# Patient Record
Sex: Male | Born: 1960 | Race: White | Hispanic: No | Marital: Married | Smoking: Current every day smoker
Health system: Southern US, Community
[De-identification: ages and names within clinical notes are randomized; demographics above are authoritative.]

## PROBLEM LIST (undated history)

## (undated) DIAGNOSIS — J449 Chronic obstructive pulmonary disease, unspecified: Secondary | ICD-10-CM

## (undated) DIAGNOSIS — J439 Emphysema, unspecified: Secondary | ICD-10-CM

## (undated) DIAGNOSIS — K859 Acute pancreatitis without necrosis or infection, unspecified: Secondary | ICD-10-CM

## (undated) DIAGNOSIS — G939 Disorder of brain, unspecified: Secondary | ICD-10-CM

## (undated) HISTORY — PX: INGUINAL HERNIA REPAIR: SHX194

## (undated) HISTORY — DX: Acute pancreatitis without necrosis or infection, unspecified: K85.90

## (undated) HISTORY — DX: Chronic obstructive pulmonary disease, unspecified: J44.9

## (undated) HISTORY — DX: Disorder of brain, unspecified: G93.9

## (undated) HISTORY — PX: PROSTATE SURGERY: SHX751

## (undated) HISTORY — DX: Emphysema, unspecified: J43.9

---

## 2010-05-17 ENCOUNTER — Ambulatory Visit (HOSPITAL_BASED_OUTPATIENT_CLINIC_OR_DEPARTMENT_OTHER): Admission: RE | Admit: 2010-05-17 | Discharge: 2010-05-17 | Payer: Self-pay | Admitting: Internal Medicine

## 2010-05-17 ENCOUNTER — Ambulatory Visit: Payer: Self-pay | Admitting: Diagnostic Radiology

## 2010-11-18 IMAGING — CT CT CHEST W/O CM
2 of 3 series · 15 of 36 positions shown, 18 images · non-contrast
Comparison: No prior chest radiographs are available.

CLINICAL DATA: Hilar mass identified on chest x-ray. History of
smoking.

CT CHEST WITHOUT CONTRAST
TECHNIQUE: Multidetector CT imaging of the chest was performed
following the standard protocol without IV contrast.

[Series 2: chest 5.0 b31f · axial · 0.73mm/px · z∈[+106,+356]mm · 12 of 60 slices shown, 15 images]
[im 5/60  mediastinal]
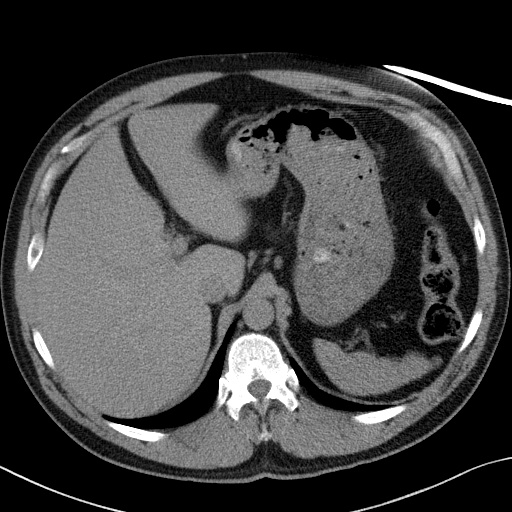
[im 5/60  lung]
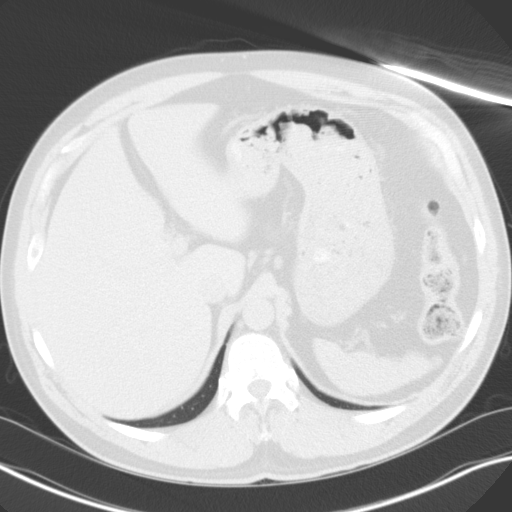
[im 9/60  lung]
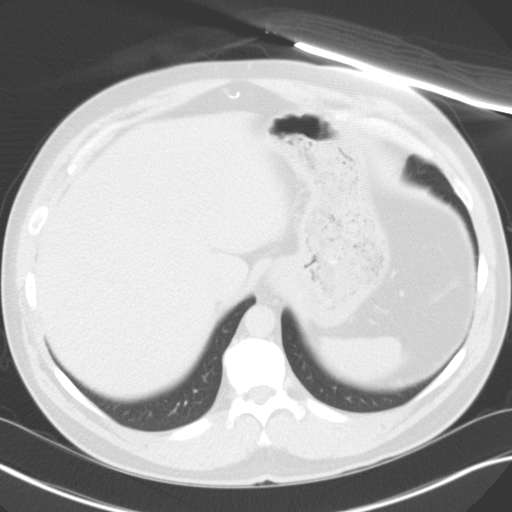
[im 14/60  lung]
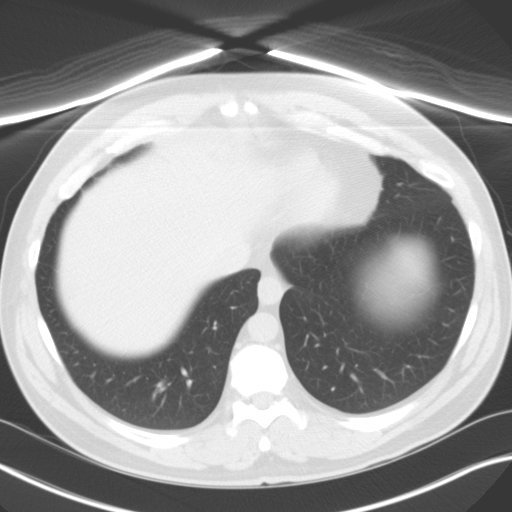
[im 18/60  lung]
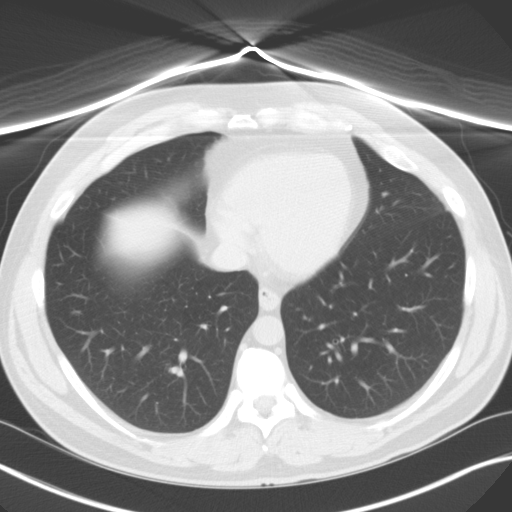
[im 22/60  mediastinal]
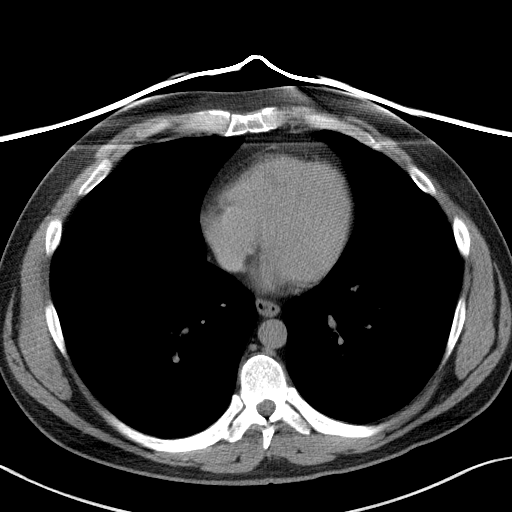
[im 22/60  lung]
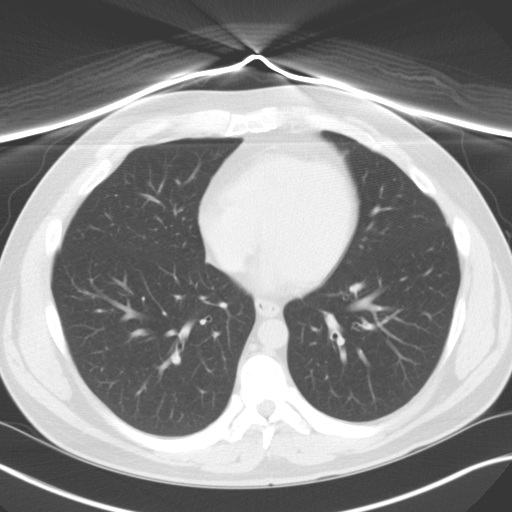
[im 27/60  lung]
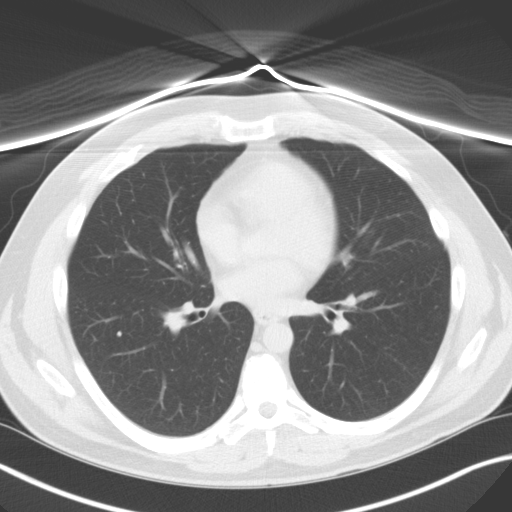
[im 33/60  lung]
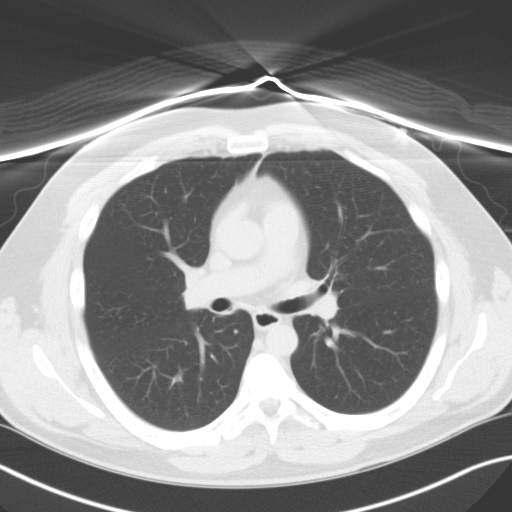
[im 38/60  lung]
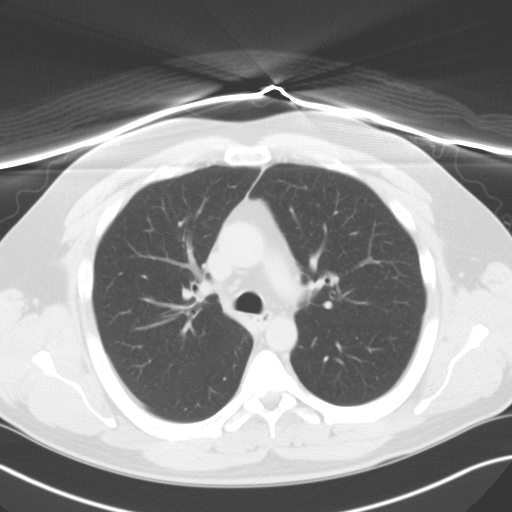
[im 42/60  mediastinal]
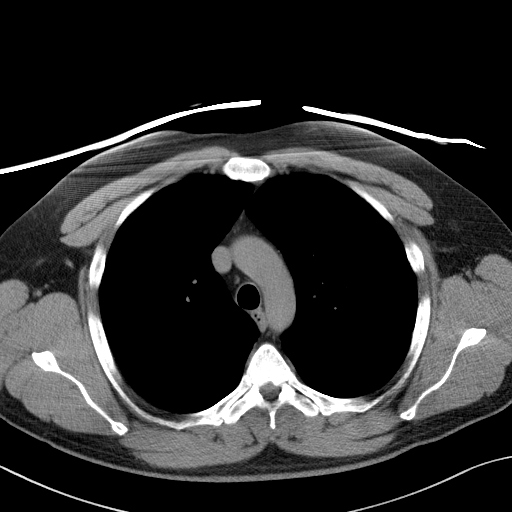
[im 42/60  lung]
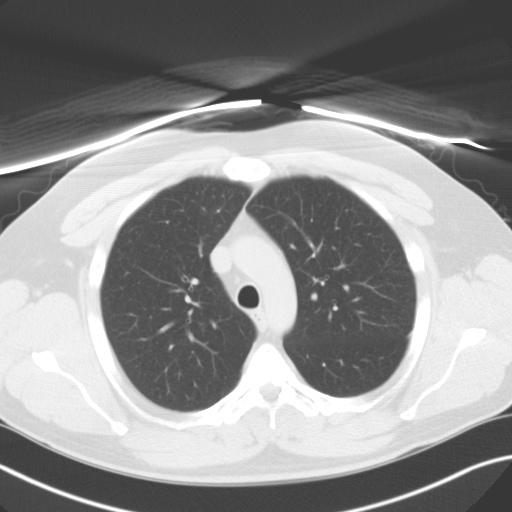
[im 46/60  lung]
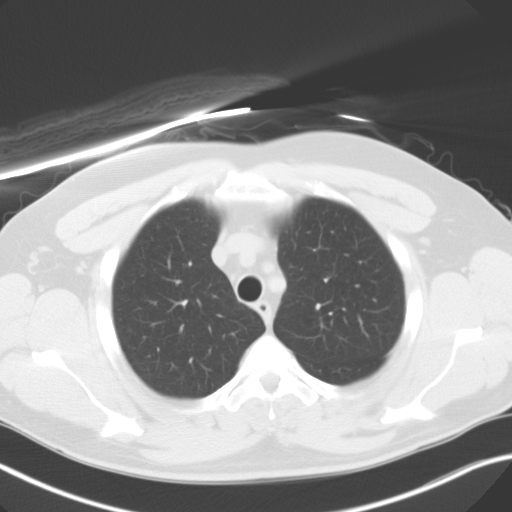
[im 51/60  lung]
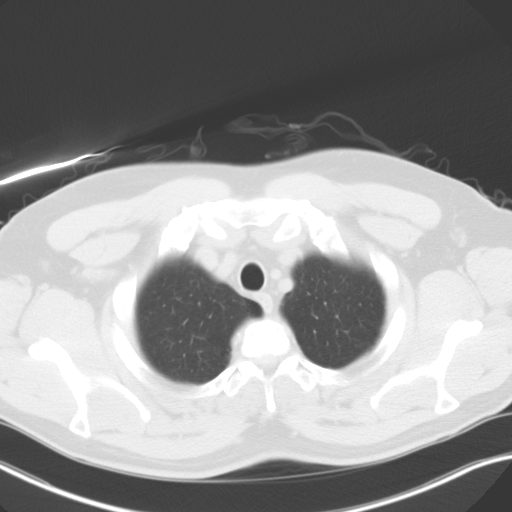
[im 55/60  lung]
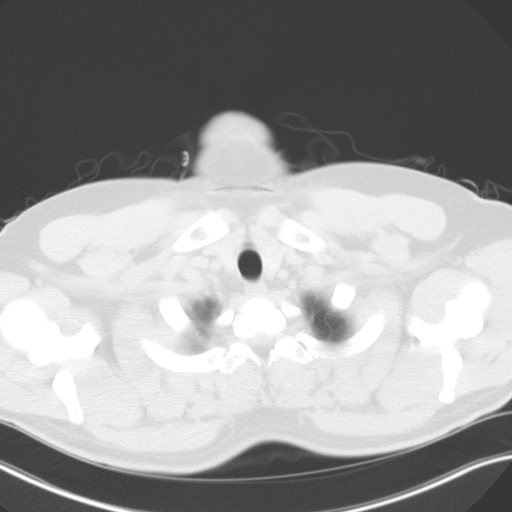

[Series 6: chest 3.0 coronal · coronal · 0.58mm/px · 3 of 101 slices shown]
[im 21/101  lung]
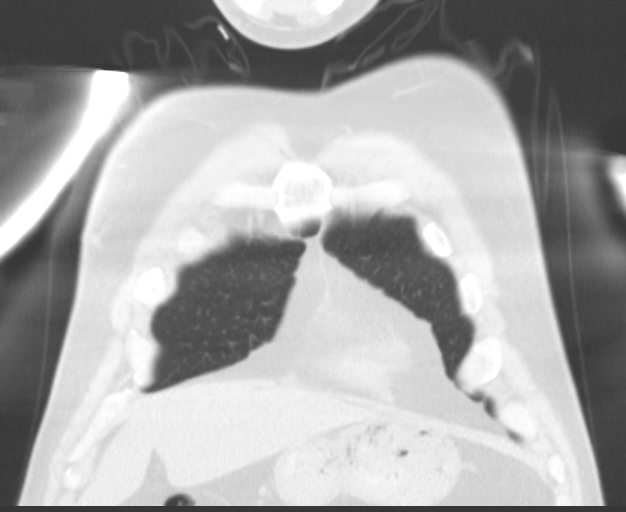
[im 41/101  lung]
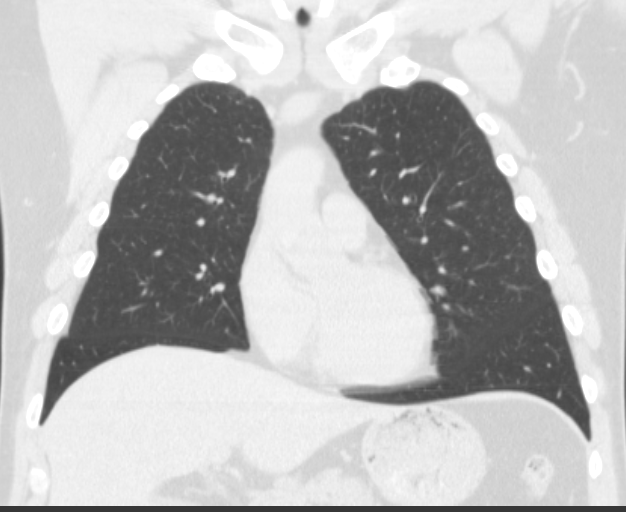
[im 61/101  lung]
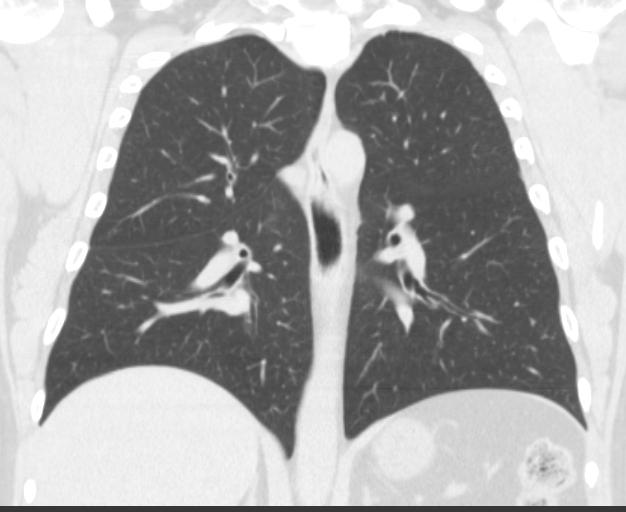

[15 of 36 positions shown; findings below may reference images not displayed]

FINDINGS: The thyroid gland and thoracic inlet are unremarkable.

No mediastinal, hilar, axillary, or supraclavicular lymphadenopathy
is identified.

Heart size is normal.

Negative for pleural pericardial effusion.

Thoracic aorta is normal in caliber.

The trachea and mainstem bronchi are patent.  The lungs are well
expanded and clear.  No mass, airspace disease, interstitial
abnormality, or pneumothorax.  Specifically, no left hilar
abnormality is seen.

Thoracic spine vertebral bodies are normal in height.  No acute or
suspicious osseous abnormality.

Visualized upper abdomen has normal appearances.
IMPRESSION: No acute or suspicious abnormality.  The lungs are clear.
Specifically, there is no hilar mass.

## 2021-07-25 ENCOUNTER — Telehealth: Payer: Self-pay | Admitting: *Deleted

## 2021-07-25 NOTE — Telephone Encounter (Signed)
R/c cd from [facility] Cd in referral dept.

## 2021-08-09 ENCOUNTER — Ambulatory Visit: Payer: Medicare Other | Admitting: Neurology

## 2021-08-09 ENCOUNTER — Encounter: Payer: Self-pay | Admitting: Neurology

## 2021-08-09 VITALS — BP 110/73 | HR 72 | Ht 70.0 in | Wt 276.2 lb

## 2021-08-09 DIAGNOSIS — R29898 Other symptoms and signs involving the musculoskeletal system: Secondary | ICD-10-CM

## 2021-08-09 DIAGNOSIS — G373 Acute transverse myelitis in demyelinating disease of central nervous system: Secondary | ICD-10-CM | POA: Diagnosis not present

## 2021-08-09 DIAGNOSIS — R208 Other disturbances of skin sensation: Secondary | ICD-10-CM | POA: Diagnosis not present

## 2021-08-09 MED ORDER — LAMOTRIGINE 100 MG PO TABS: ORAL_TABLET | ORAL | 5 refills | Status: AC

## 2021-08-09 NOTE — Patient Instructions (Addendum)
The pharmacy has the prescription for lamotrigine 100 mg tablets. For 5 days, just take one half pill a day. For the next 5 days, take one half pill twice a day. For the next 5 days, take one half pill 3 times a day Then start taking one pill twice a day from this point on.      If you get a rash, need to stop the medication and not take it again. 

## 2021-08-09 NOTE — Progress Notes (Signed)
[facility]  PATIENT: Brent Barron DOB: September 18, 1961  REFERRING DOCTOR OR PCP: Creig Hines, MD SOURCE: Patient, notes from Dr. Ermalene Postin, imaging and lab reports, MRI images personally reviewed.  _________________________________   HISTORICAL  CHIEF COMPLAINT:  Chief Complaint  Patient presents with   New Patient (Initial Visit)    New rm, w wife kelley. Paper referral for a 2nd opinion to rule out MS.     HISTORY OF PRESENT ILLNESS:  I had the pleasure of seeing your patient, Brent Barron, at [facility] for neurologic consultation regarding his right leg weakness, bilateral leg dysesthesias and abnormal thoracic spine MRI.  He is a 60 yo man who had the onset of right leg weakness followed by dysesthesias in his legs and back pain.      Initially he thought his symptoms may have been due to a pinched nerve but when symptoms worsened  he had a thoracic spine MRI showing a T2 hyperintense lesion at T7.   On initial imaging, it was felt that this might represent a syrinx.  Later, transverse myelitis was also felt to be a possible etiology..  At that time of the MRi, he was weaker than now and needed to use a cane or walker.  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   A follow up contrasted study of the thoracic spine in 2019 showed no enhancement.     He continiues to experience weakness in that leg.   Currently, he has a sharp sensation like an ice pick around the T7 levelHe has an electric sensation in the right leg at times.   He feels a 'Public librarian' sensation n the legs, from the abdomen and down on the right and from the knee down on the left.  He has been placed on Lyrica 300 mg po bid and Flexeril.   Pain is worse if he misses some of the Lyrica.      He had repeat imaging studies July 2022.  The MRI of the thoracic spine showed the lesion at T7 was reduced in size with  less T2 hyperintensity.  The MRI of the brain showed some scattered nonspecific T2/FLAIR hyperintense foci in the hemispheres.  There were no preceding viral syndromes or vaccinations before his symptoms.     Vascular risk factors: Diabetes mellitus, hypertension, smoking.  Images reviewed today MRI brain 07/08/2021 shows some scattered T2/FLAIR hyperintense foci in the hemispheres.  These have a nonspecific appearance and most likely represent chronic microvascular ischemic change.  None of the foci appear to be acute.  MRI of the thoracic spine with and without contrast 06/23/2021 shows a subtle T2 hyperintense focus adjacent to T7.  It has become smaller in size compared to the MRI from 2019.  It does not enhance.  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  There was mild left hip DJD.  MRI of the lumbar spine 10/18/2019 showed facet hypertrophy at L4-L5 and L5-S1 and a small disc bulge at L5-S1.  Mild foraminal narrowing at L5-S1 but no nerve root compression.  MRI of the thoracic spine 11/28/2018 shows a focus at T7 more to the right.  It did not enhance on contrast study.  There was mild DJD at T5-T6 but notes nerve root compression or spinal stenosis. 12/17/2018.  Labs: ANA, ANCA, B12,  SPEP, ESR, CRP, CK, paraneoplastic autoantibody evaluation were normal or negative negative in August 2019  REVIEW OF SYSTEMS: Constitutional: No fevers, chills, sweats, or change in appetite Eyes: No visual changes, double vision, eye pain Ear, nose and throat: No hearing loss, ear pain, nasal congestion, sore throat Cardiovascular: No chest pain, palpitations Respiratory:  No shortness of breath at rest or with exertion.   No wheezes GastrointestinaI: No nausea, vomiting, diarrhea, abdominal pain, fecal incontinence Genitourinary:  No dysuria, urinary retention or frequency.  No  nocturia. Musculoskeletal: As above Integumentary: No rash, pruritus, skin lesions Neurological: as above Psychiatric: No depression at this time.  No anxiety Endocrine: No palpitations, diaphoresis, change in appetite, change in weigh or increased thirst Hematologic/Lymphatic:  No anemia, purpura, petechiae. Allergic/Immunologic: No itchy/runny eyes, nasal congestion, recent allergic reactions, rashes  ALLERGIES: Not on File  HOME MEDICATIONS:  Current Outpatient Medications:    albuterol (VENTOLIN HFA) 108 (90 Base) MCG/ACT inhaler, Inhale 2 puffs into the lungs every 6 (six) hours as needed., Disp: , Rfl:    amLODipine (NORVASC) 10 MG tablet, Take 10 mg by mouth daily., Disp: , Rfl:    atorvastatin (LIPITOR) 20 MG tablet, Take 20 mg by mouth daily., Disp: , Rfl:    Budeson-Glycopyrrol-Formoterol (BREZTRI AEROSPHERE) 160-9-4.8 MCG/ACT AERO, Inhale 2 puffs into the lungs 2 (two) times daily., Disp: , Rfl:    cetirizine (ZYRTEC) 10 MG tablet, Take 10 mg by mouth daily., Disp: , Rfl:    Cobalamin Combinations (B12 FOLATE) 800-800 MCG CAPS, Take 1 capsule by mouth daily., Disp: , Rfl:    cyanocobalamin 1000 MCG tablet, Take 500 mcg by mouth daily., Disp: , Rfl:    cyclobenzaprine (FLEXERIL) 10 MG tablet, Take 10 mg by mouth daily as needed., Disp: , Rfl:    fluticasone (FLONASE) 50 MCG/ACT nasal spray, Place 1 spray into both nostrils daily., Disp: , Rfl:    furosemide (LASIX) 20 MG tablet, Take 20 mg by mouth daily., Disp: , Rfl:    glyBURIDE (DIABETA) 5 MG tablet, Take 5 mg by mouth every morning., Disp: , Rfl:    ipratropium-albuterol (DUONEB) 0.5-2.5 (3) MG/3ML SOLN, Take 3 mLs by nebulization 4 (four) times daily., Disp: , Rfl:    metoprolol tartrate (LOPRESSOR) 25 MG tablet, Take 25 mg by mouth 2 (two) times daily., Disp: , Rfl:    omeprazole (PRILOSEC) 40 MG capsule, Take 40 mg by mouth 2 (two) times daily., Disp: , Rfl:    pioglitazone (ACTOS) 30 MG tablet, Take 30 mg by mouth  daily., Disp: , Rfl:    pregabalin (LYRICA) 300 MG capsule, Take 300 mg by mouth 2 (two) times daily., Disp: , Rfl:    tamsulosin (FLOMAX) 0.4 MG CAPS capsule, Take 0.4 mg by mouth daily., Disp: , Rfl:    Thiamine HCl (B-1) 100 MG TABS, Take 1 tablet by mouth daily., Disp: , Rfl:   PAST MEDICAL HISTORY: Past Medical History:  Diagnosis Date   Brain lesion    COPD (chronic obstructive pulmonary disease) ([place])    Emphysema lung ([place])    Pancreatitis     PAST SURGICAL HISTORY: Past Surgical History:  Procedure Laterality Date   INGUINAL HERNIA REPAIR     PROSTATE SURGERY      FAMILY HISTORY: Family History  Problem Relation Age of Onset   Diabetes Mother    COPD Father    Emphysema Father    Down syndrome Brother     SOCIAL HISTORY:  Social History  Socioeconomic History   Marital status: Married    Spouse name: Georgina Peer   Number of children: 5   Years of education: Not on file   Highest education level: 8th grade  Occupational History   Not on file  Tobacco Use   Smoking status: Every Day    Packs/day: 1.00    Years: 50.00    Pack years: 50.00    Types: Cigarettes   Smokeless tobacco: Never  Substance and Sexual Activity   Alcohol use: Yes    Comment: half a gallon a week   Drug use: Never   Sexual activity: Not on file  Other Topics Concern   Not on file  Social History Narrative   Lives with wife   Right handed   Caffeine: 2 litters of mellow yellow, trying to cut down to 1 a day   Social Determinants of Radio broadcast assistant Strain: Not on file  Food Insecurity: Not on file  Transportation Needs: Not on file  Physical Activity: Not on file  Stress: Not on file  Social Connections: Not on file  Intimate Partner Violence: Not on file     PHYSICAL EXAM  Vitals:   08/09/21 1329  BP: 110/73  Pulse: 72  Weight: 276 lb 3.2 oz (125.3 kg)  Height: 5' 10"  (1.778 m)    Body mass index is 39.63 kg/m.   General: The patient is  well-developed and well-nourished and in no acute distress  HEENT:  Head is [place]/AT.  Sclera are anicteric.  Funduscopic exam shows normal optic discs and retinal vessels.  Neck: No carotid bruits are noted.  The neck is nontender.  Cardiovascular: The heart has a regular rate and rhythm with a normal S1 and S2. There were no murmurs, gallops or rubs.    Skin: Extremities are without rash or  edema.  Musculoskeletal:  Back is mildly tender in the mid to lower thoracic region  Neurologic Exam  Mental status: The patient is alert and oriented x 3 at the time of the examination. The patient has apparent normal recent and remote memory, with an apparently normal attention span and concentration ability.   Speech is normal.  Cranial nerves: Extraocular movements are full. Pupils are equal, round, and reactive to light and accomodation.  Visual fields are full.  Facial symmetry is present. There is good facial sensation to soft touch bilaterally.Facial strength is normal.  Trapezius and sternocleidomastoid strength is normal. No dysarthria is noted.  The tongue is midline, and the patient has symmetric elevation of the soft palate. No obvious hearing deficits are noted.  Motor:  Muscle bulk is normal.   Tone is normal. Strength is  5 / 5 in arms and left leg and 4 -/5 at the right foot and ankle and 4/5 more proximally in the right leg..   Sensory: He has normal sensation in the arms and upper trunk.  He had slightly asymmetric sensation in the lower thoracic/lumbar region of the abdomen on the right.  He reported altered sensation to touch and temperature in the legs, worse on the right and involving the entire right leg.  On the left leg, this involved only below the knee.  Sensation was the same at the toes as it was at the ankles.  Vibration sensation was the same at the toes as it was at the ankles but less in the legs than the hands  Coordination: Cerebellar testing reveals good  finger-nose-finger and heel-to-shin bilaterally.  Gait and  station: Station is normal.   He has a foot drop on the right.  Tandem was poor.  Romberg was negative.  reflexes: Deep tendon reflexes are symmetric .   Plantar responses are flexor.      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 Sindelar is a 60 year old man with right leg weakness and right greater than left dysesthesias since 2019.  There has been some improvement in strength but little improvement of the sensory symptoms.  MRI has shown a focus at T7 that appeared to be associated with inflammation but not enhancement in 2019.  The appearance is much more typical for transverse myelitis than syringomyelia.  I discussed with him and his wife that I feel he most likely had a transverse myelitis episode.  This is most likely  postviral or idiopathic as there was no clear-cut explanation for the onset.  Vasculitis labs in 2019 were normal.  I will check an anti-NMO antibody though the appearance would not be typical.  He continues to experience weakness and dysesthesias.  2 years out, it is not likely that there would be much further improvement.  The dysesthesias are likely from the spinal cord lesion.  I will add lamotrigine and we will continue the pregabalin.  I will titrate the lamotrigine up to 100 mg twice a day and this can be increased further if well-tolerated and he gets a partial benefit.  He will return to see me as needed if there are new or worsening symptoms.  He should call us if he needs a dose adjustment after he has been on lamotrigine for more than 6 weeks  Thank you for asking me to see Mr. Friedmann.  Please let me know can be of further assistance with him or other patients in the future.   Heavyn Yearsley A. Felecia Shelling, MD, [facility] 04/06/8207, 1:38 PM Certified in Neurology, Clinical Neurophysiology, Sleep Medicine and  Neuroimaging  [facility] [address] [place], [place] [address] [phone]

## 2021-08-12 LAB — NEUROMYELITIS OPTICA AUTOAB, IGG: NMO IgG Autoantibodies: 1.7 U/mL (ref 0.0–3.0)

## 2021-08-15 ENCOUNTER — Telehealth: Payer: Self-pay | Admitting: *Deleted

## 2021-08-15 NOTE — Telephone Encounter (Signed)
-----   Message from Asa Lente, MD sent at 08/12/2021  5:48 PM EDT ----- Please let him know that the blood test was normal.

## 2021-08-15 NOTE — Telephone Encounter (Signed)
Called and LVM for pt about results per Dr. Bonnita Hollow note. Advised he did not have to return call unless he had further questions/concerns.

## 2021-09-06 ENCOUNTER — Telehealth: Payer: Self-pay | Admitting: Neurology

## 2021-09-06 NOTE — Telephone Encounter (Signed)
The following MRIs were reviewed: MRI of the brain 07/06/2021 shows nonspecific white matter changes in the hemispheres.  MRI of the thoracic spine 06/23/2021 shows a T2 hyperintense focus adjacent to T7.  It is central and does not enhance.  MRI of the thoracic spine 12/17/2018 shows a T2 hyperintense focus adjacent to T7 associated with edema and expansion of the cord.  There is no enhancement.
